# Patient Record
Sex: Male | Born: 1935 | Race: White | Hispanic: No | Smoking: Never smoker
Health system: Southern US, Community
[De-identification: ages and names within clinical notes are randomized; demographics above are authoritative.]

## PROBLEM LIST (undated history)

## (undated) DIAGNOSIS — C801 Malignant (primary) neoplasm, unspecified: Secondary | ICD-10-CM

## (undated) DIAGNOSIS — H269 Unspecified cataract: Secondary | ICD-10-CM

## (undated) HISTORY — DX: Malignant (primary) neoplasm, unspecified: C80.1

## (undated) HISTORY — DX: Unspecified cataract: H26.9

---

## 1968-12-10 HISTORY — PX: HERNIA REPAIR: SHX51

## 1968-12-10 HISTORY — PX: CATARACT EXTRACTION, BILATERAL: SHX1313

## 2010-12-10 DIAGNOSIS — C801 Malignant (primary) neoplasm, unspecified: Secondary | ICD-10-CM

## 2010-12-10 HISTORY — PX: PEG TUBE PLACEMENT: SUR1034

## 2010-12-10 HISTORY — DX: Malignant (primary) neoplasm, unspecified: C80.1

## 2011-10-05 ENCOUNTER — Ambulatory Visit: Payer: Self-pay

## 2012-04-18 ENCOUNTER — Emergency Department: Payer: Self-pay | Admitting: Emergency Medicine

## 2012-10-28 ENCOUNTER — Emergency Department: Payer: Self-pay | Admitting: Emergency Medicine

## 2012-11-01 ENCOUNTER — Emergency Department: Payer: Self-pay | Admitting: Emergency Medicine

## 2012-11-01 LAB — URINALYSIS, COMPLETE
Bilirubin,UR: NEGATIVE
Glucose,UR: NEGATIVE mg/dL (ref 0–75)
Leukocyte Esterase: NEGATIVE
Nitrite: NEGATIVE
Ph: 5 (ref 4.5–8.0)
RBC,UR: 8 /HPF (ref 0–5)
Squamous Epithelial: <1
WBC UR: 5 /HPF (ref 0–5)

## 2012-11-01 LAB — CBC
HGB: 13.7 g/dL (ref 13.0–18.0)
MCH: 35.8 pg — ABNORMAL HIGH (ref 26.0–34.0)
MCHC: 34.3 g/dL (ref 32.0–36.0)
Platelet: 170 10*3/uL (ref 150–440)
RBC: 3.83 10*6/uL — ABNORMAL LOW (ref 4.40–5.90)

## 2012-11-01 LAB — COMPREHENSIVE METABOLIC PANEL
Albumin: 3.7 g/dL (ref 3.4–5.0)
Anion Gap: 5 — ABNORMAL LOW (ref 7–16)
Bilirubin,Total: 0.7 mg/dL (ref 0.2–1.0)
Calcium, Total: 8.8 mg/dL (ref 8.5–10.1)
Co2: 26 mmol/L (ref 21–32)
Creatinine: 0.82 mg/dL (ref 0.60–1.30)
EGFR (Non-African Amer.): >60
Glucose: 97 mg/dL (ref 65–99)
Osmolality: 286 (ref 275–301)
Sodium: 142 mmol/L (ref 136–145)
Total Protein: 7 g/dL (ref 6.4–8.2)

## 2012-11-01 LAB — LIPASE, BLOOD: Lipase: 493 U/L — ABNORMAL HIGH (ref 73–393)

## 2012-11-03 ENCOUNTER — Emergency Department: Payer: Self-pay | Admitting: Emergency Medicine

## 2012-11-03 LAB — COMPREHENSIVE METABOLIC PANEL
Albumin: 3.8 g/dL (ref 3.4–5.0)
Alkaline Phosphatase: 126 U/L (ref 50–136)
Anion Gap: 8 (ref 7–16)
BUN: 18 mg/dL (ref 7–18)
Bilirubin,Total: 0.8 mg/dL (ref 0.2–1.0)
Co2: 24 mmol/L (ref 21–32)
Creatinine: 0.85 mg/dL (ref 0.60–1.30)
EGFR (African American): >60
EGFR (Non-African Amer.): >60
Glucose: 97 mg/dL (ref 65–99)
Osmolality: 279 (ref 275–301)
Potassium: 3.4 mmol/L — ABNORMAL LOW (ref 3.5–5.1)
Sodium: 139 mmol/L (ref 136–145)

## 2012-11-03 LAB — URINALYSIS, COMPLETE
Leukocyte Esterase: NEGATIVE
Nitrite: NEGATIVE
Ph: 5 (ref 4.5–8.0)
Protein: 30
RBC,UR: 21 /HPF (ref 0–5)
Transitional Epi: <1
WBC UR: 7 /HPF (ref 0–5)

## 2012-11-03 LAB — CBC WITH DIFFERENTIAL/PLATELET
Basophil #: 0 10*3/uL (ref 0.0–0.1)
Basophil %: 0.4 %
Eosinophil #: 0.1 10*3/uL (ref 0.0–0.7)
HCT: 40.8 % (ref 40.0–52.0)
HGB: 14.2 g/dL (ref 13.0–18.0)
MCH: 36.2 pg — ABNORMAL HIGH (ref 26.0–34.0)
MCV: 104 fL — ABNORMAL HIGH (ref 80–100)
Monocyte %: 7.3 %
Platelet: 170 10*3/uL (ref 150–440)
RBC: 3.93 10*6/uL — ABNORMAL LOW (ref 4.40–5.90)

## 2012-11-03 LAB — LIPASE, BLOOD: Lipase: 706 U/L — ABNORMAL HIGH (ref 73–393)

## 2012-11-05 LAB — STOOL CULTURE

## 2012-11-10 ENCOUNTER — Emergency Department: Payer: Self-pay | Admitting: Emergency Medicine

## 2012-11-10 LAB — COMPREHENSIVE METABOLIC PANEL
Albumin: 3.1 g/dL — ABNORMAL LOW (ref 3.4–5.0)
Anion Gap: 5 — ABNORMAL LOW (ref 7–16)
BUN: 17 mg/dL (ref 7–18)
Bilirubin,Total: 0.5 mg/dL (ref 0.2–1.0)
Co2: 28 mmol/L (ref 21–32)
Creatinine: 0.78 mg/dL (ref 0.60–1.30)
Glucose: 98 mg/dL (ref 65–99)
Potassium: 3.5 mmol/L (ref 3.5–5.1)
SGOT(AST): 57 U/L — ABNORMAL HIGH (ref 15–37)
SGPT (ALT): 79 U/L — ABNORMAL HIGH (ref 12–78)
Sodium: 138 mmol/L (ref 136–145)
Total Protein: 6.1 g/dL — ABNORMAL LOW (ref 6.4–8.2)

## 2012-11-10 LAB — MAGNESIUM: Magnesium: 2.1 mg/dL

## 2013-08-12 ENCOUNTER — Emergency Department: Payer: Self-pay | Admitting: Emergency Medicine

## 2013-12-04 IMAGING — CT CT ABD-PELV W/ CM
1 of 2 series · 14 of 32 positions shown, 18 images · non-contrast
Comparison: none

REASON FOR EXAM: (1) PANCREATITIS AND DIARRHEA; (2) PANCREATITIS AND
DIARRHEA
COMMENTS:

[Series 2: 3mm soft tissue · axial · 0.71mm/px · z∈[-778,-404]mm · 14 of 139 slices shown, 18 images]
[im 7/139  soft-tissue]
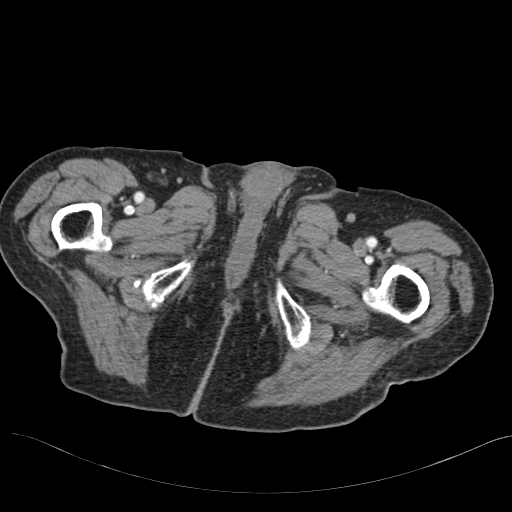
[im 7/139  bone]
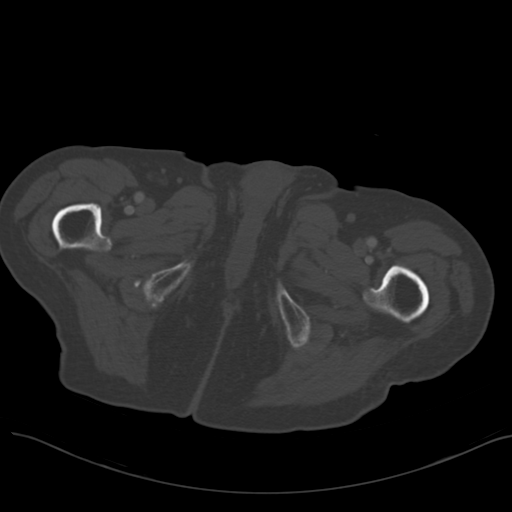
[im 19/139  soft-tissue]
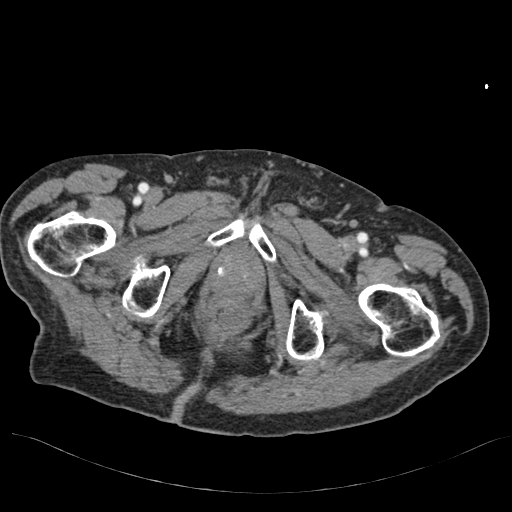
[im 32/139  soft-tissue]
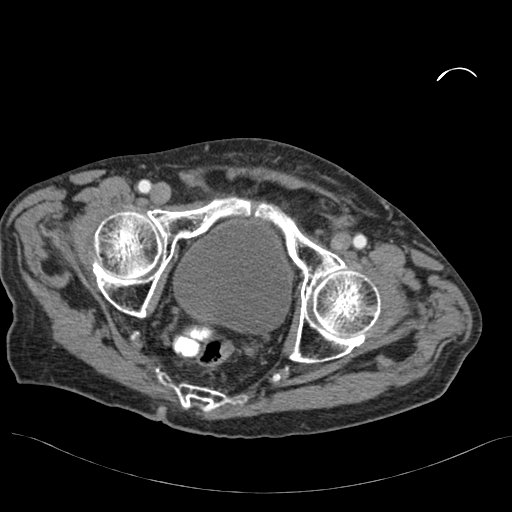
[im 44/139  soft-tissue]
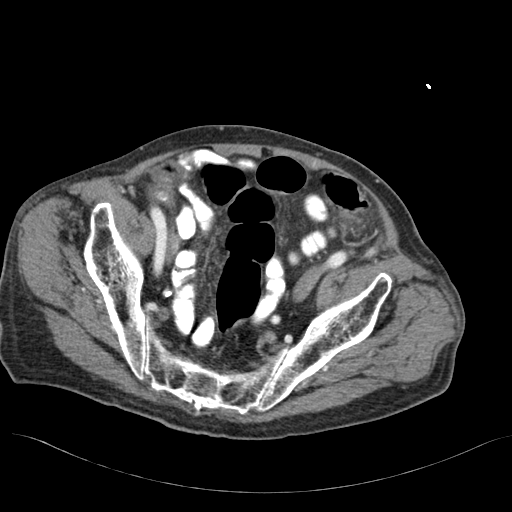
[im 51/139  soft-tissue]
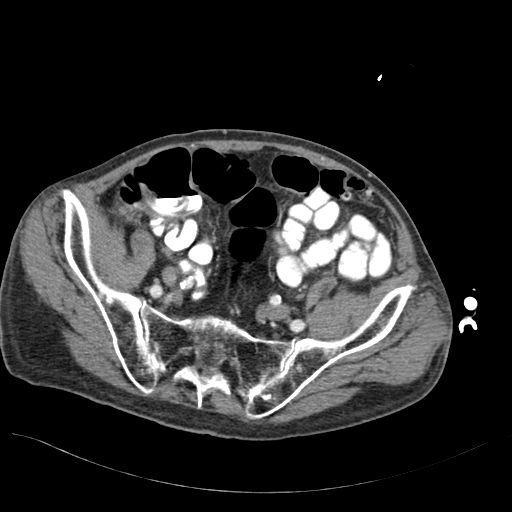
[im 63/139  soft-tissue]
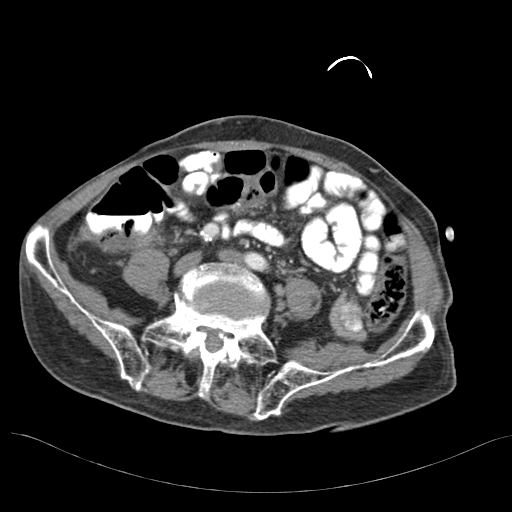
[im 76/139  soft-tissue]
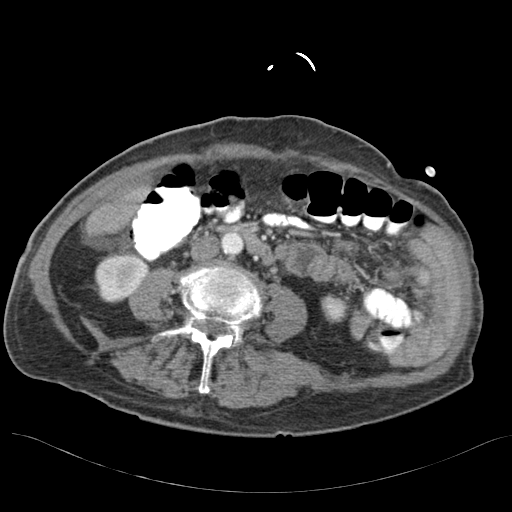
[im 88/139  soft-tissue]
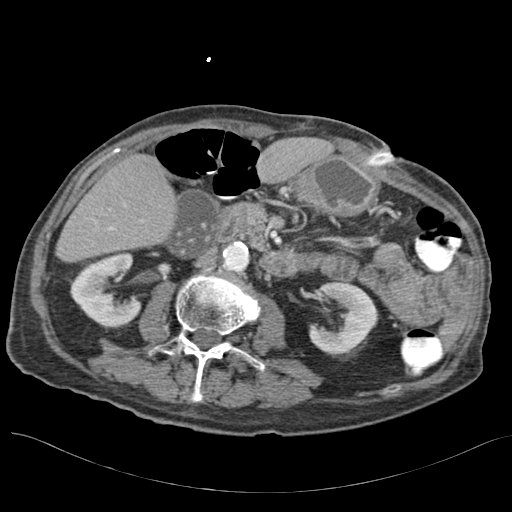
[im 95/139  soft-tissue]
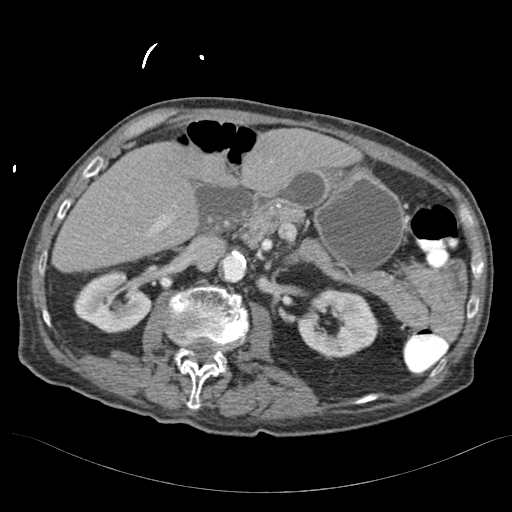
[im 95/139  bone]
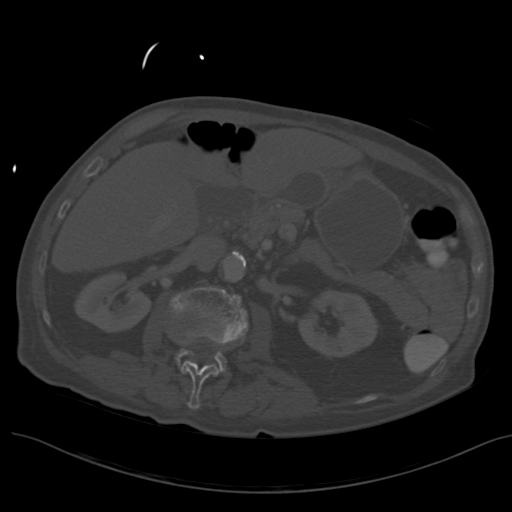
[im 107/139  soft-tissue]
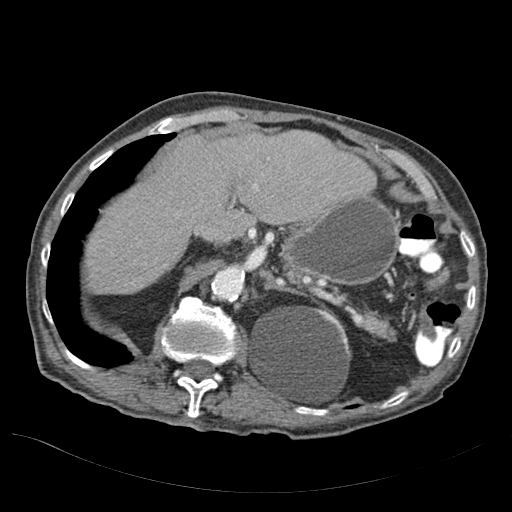
[im 113/139  lung]
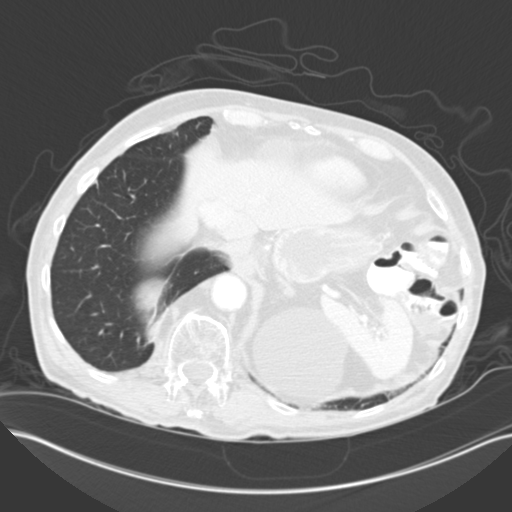
[im 120/139  soft-tissue]
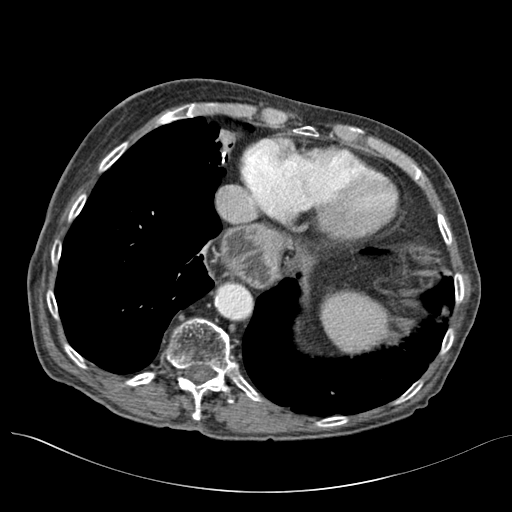
[im 120/139  lung]
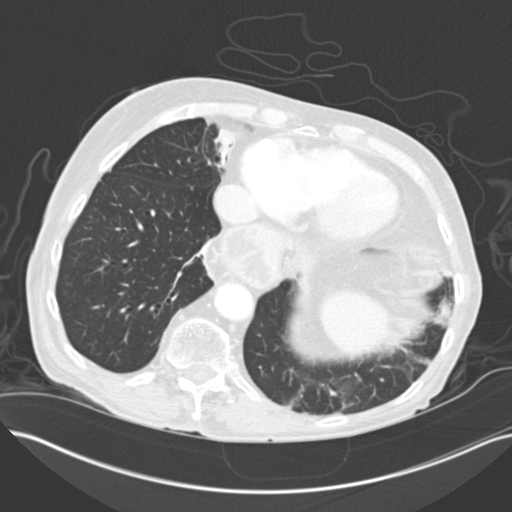
[im 126/139  lung]
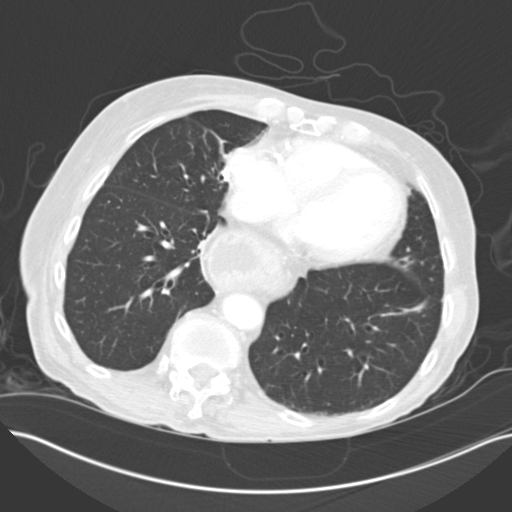
[im 132/139  soft-tissue]
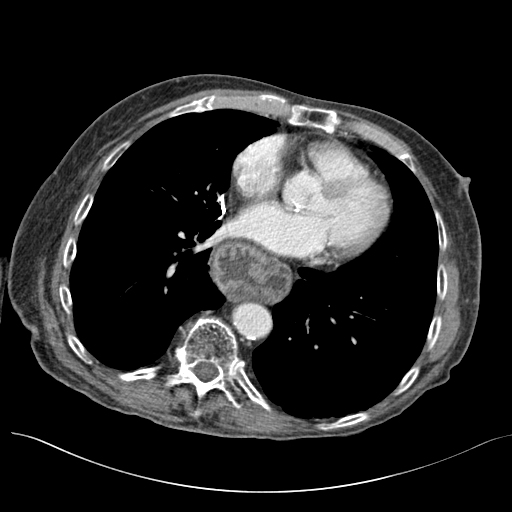
[im 132/139  lung]
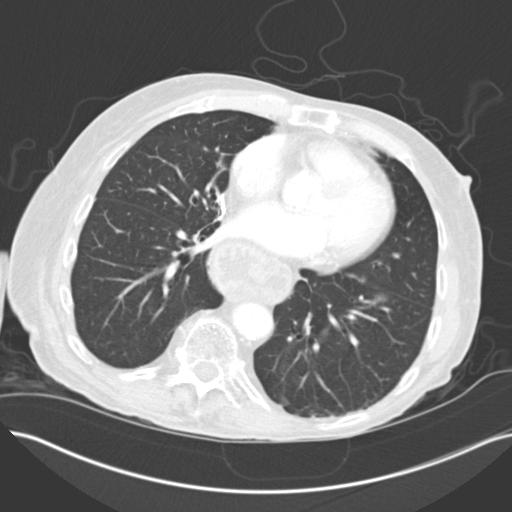

[14 of 32 positions shown; findings below may reference images not displayed]

PROCEDURE:     CT  - CT ABDOMEN / PELVIS  W  - November 01, 2012  [DATE]

RESULT:     CT of the abdomen and pelvis is performed with 100 mL of
[id number] iodinated intravenous contrast. Oral contrast was administered
but is primarily in the mid to distal small bowel and colon. Gastrostomy
tube is present. There is a moderate amount of fluid in the stomach. There
is a moderately large hiatal hernia at the upper margin of the film. The
lung bases show some emphysematous change and minimal atelectasis versus
fibrosis without a discrete mass. There may be some right middle lobe medial
segment bronchiectasis. No pleural or pericardial effusion is evident. There
is a large cyst appears to arise from the upper pole of the left kidney
measuring as much as 7.23 x 7.1 cm. There is a small cyst in the lower pole
the right kidney. Multiple stones are seen in a distended gallbladder. The
liver appears unremarkable. The spleen is within normal limits. The pancreas
is unremarkable. The adrenal glands appear normal. The abdominal aorta shows
atherosclerotic calcification without aneurysm. There is no significant
ascites. The urinary bladder is not abnormally distended. There is some
colonic diverticulosis with some mild adjacent inflammation in the mesentery
in the distal descending colon and proximal sigmoid colon which could
represent very mild diverticulitis. There is no perforation or abscess
formation evident. The appendix appears normal. There is a trace amount of
fluid in Morison's pouch and in the right retrocolic gutter. There may be
some mild thickening of the wall of the cecum or adjacent to the cecum. The
prostate is enlarged. Correlate with PSA and digital rectal exam.
Degenerative changes are noted in the spine.
IMPRESSION: 1. Colonic diverticulosis. Cannot exclude some mild left lower quadrant
diverticulitis. Possible pericecal inflammatory changes. Minimal wall
thickening is not excluded. The appendix appears normal.
2. Cholelithiasis without evidence of acute cholecystitis. Minimal amount of
free fluid in Morrison's pouch.
3. Other findings as listed above.

[REDACTED]

## 2014-04-02 ENCOUNTER — Emergency Department: Payer: Self-pay | Admitting: Emergency Medicine

## 2014-04-08 ENCOUNTER — Observation Stay: Payer: Self-pay | Admitting: Specialist

## 2014-04-08 LAB — CBC
HCT: 43.4 % (ref 40.0–52.0)
HGB: 14.6 g/dL (ref 13.0–18.0)
MCH: 34.7 pg — AB (ref 26.0–34.0)
MCHC: 33.6 g/dL (ref 32.0–36.0)
MCV: 103 fL — ABNORMAL HIGH (ref 80–100)
PLATELETS: 169 10*3/uL (ref 150–440)
RBC: 4.2 10*6/uL — ABNORMAL LOW (ref 4.40–5.90)
RDW: 12.5 % (ref 11.5–14.5)
WBC: 5.5 10*3/uL (ref 3.8–10.6)

## 2014-04-08 LAB — COMPREHENSIVE METABOLIC PANEL
ALBUMIN: 3.6 g/dL (ref 3.4–5.0)
ALK PHOS: 106 U/L
Anion Gap: 8 (ref 7–16)
BILIRUBIN TOTAL: 0.8 mg/dL (ref 0.2–1.0)
BUN: 20 mg/dL — ABNORMAL HIGH (ref 7–18)
CO2: 27 mmol/L (ref 21–32)
Calcium, Total: 9.2 mg/dL (ref 8.5–10.1)
Chloride: 96 mmol/L — ABNORMAL LOW (ref 98–107)
Creatinine: 0.71 mg/dL (ref 0.60–1.30)
EGFR (African American): >60
Glucose: 139 mg/dL — ABNORMAL HIGH (ref 65–99)
Osmolality: 268 (ref 275–301)
Potassium: 4.3 mmol/L (ref 3.5–5.1)
SGOT(AST): 42 U/L — ABNORMAL HIGH (ref 15–37)
SGPT (ALT): 42 U/L (ref 12–78)
Sodium: 131 mmol/L — ABNORMAL LOW (ref 136–145)
TOTAL PROTEIN: 7.6 g/dL (ref 6.4–8.2)

## 2014-04-08 LAB — LIPASE, BLOOD: Lipase: 214 U/L (ref 73–393)

## 2014-04-09 LAB — CBC WITH DIFFERENTIAL/PLATELET
BASOS ABS: 0 10*3/uL (ref 0.0–0.1)
BASOS PCT: 0.8 %
EOS PCT: 1.8 %
Eosinophil #: 0.1 10*3/uL (ref 0.0–0.7)
HCT: 36.1 % — ABNORMAL LOW (ref 40.0–52.0)
HGB: 12.1 g/dL — ABNORMAL LOW (ref 13.0–18.0)
Lymphocyte #: 0.9 10*3/uL — ABNORMAL LOW (ref 1.0–3.6)
Lymphocyte %: 19.9 %
MCH: 34.3 pg — ABNORMAL HIGH (ref 26.0–34.0)
MCHC: 33.6 g/dL (ref 32.0–36.0)
MCV: 102 fL — ABNORMAL HIGH (ref 80–100)
Monocyte #: 0.6 x10 3/mm (ref 0.2–1.0)
Monocyte %: 14.1 %
Neutrophil #: 2.9 10*3/uL (ref 1.4–6.5)
Neutrophil %: 63.4 %
Platelet: 145 10*3/uL — ABNORMAL LOW (ref 150–440)
RBC: 3.54 10*6/uL — AB (ref 4.40–5.90)
RDW: 12.3 % (ref 11.5–14.5)
WBC: 4.6 10*3/uL (ref 3.8–10.6)

## 2015-04-02 NOTE — Consult Note (Signed)
Pt seen and examined. Full consult to follow. Hx of G tube placement several years ago for nutritional support. Hx of throat ca. Had G tube replaced in ER 1 wk ago. Some bleeding at G site with few day hx of melena. Admitted overnight for observation. There was some drop in hgb. However, no abd pain. No active bleeding at G site. No melena. Tolerating TF without any problems. Pt prob had some bleeding at G insertion site. Pt not interested in any EGD. Stable for discharge today. Can give daily PPI. If patient cannot swallow pills, then give PPI via G tube, ex. powder form of prevacid or protonix suspension. Will sign off. Thanks.   Electronic Signatures: Verdie Shire (MD) (Signed on 01-May-15 07:58)  Authored   Last Updated: 01-May-15 07:59 by Verdie Shire (MD)

## 2015-04-02 NOTE — Consult Note (Signed)
PATIENT NAME:  Sean Boyd, Sean Boyd MR#:  [id number] DATE OF BIRTH:  [birth year]  DATE OF ADMISSION:  04/08/2014 DATE OF CONSULTATION:  04/09/2014  CONSULTING PHYSICIAN:  Lupita Dawn. Candace Cruise, MD  REASON FOR REFERRAL:  Melena.   The patient is a 81 year old white male who has a history of throat cancer and had a G-tube placed elsewhere several years ago. He comes into the Emergency Room because of some bleeding around his G-tube site. Apparently, the G-tube fell out about a week ago and had it replaced in the Emergency Room on April 24. He had noticed some black stools over the last 2 days. He was having some constipation, took enema yesterday; that is when he noticed some melanotic stool. In the Emergency Room, he was hemodynamically stable. His hemoglobin was stable. However, because of his age, the decision was made to bring the patient in for observation overnight. I was asked to see the patient this morning. The patient was sleeping initially. He is rather hard of hearing. He was not able to provide a whole lot of history. However, he said he has no trouble with abdominal pain. He denied having any abdominal pain and he was tolerating tube feeding without any difficulty. Did not notice any melena since being admitted for observation.   PAST MEDICAL HISTORY:  Notable for throat cancer and hypothyroidism.   He has no known drug allergies.   He takes levothyroxine. He takes Jevity 1.5, 5 cans daily.   PAST SURGICAL HISTORY:  Includes G-tube placement.   SOCIAL HISTORY:  He lives with his daughter. Denies any alcohol or tobacco history.   FAMILY HISTORY:  Notable for hypertension.   REVIEW OF SYSTEMS:  There are no fevers, chills or weakness. There are no visual changes or hearing changes. There is no coughing or shortness of breath. There is no chest pain or palpitations. There is no nausea, vomiting or diarrhea and no abdominal pain. The rest of the review of symptoms is negative.   PHYSICAL  EXAMINATION: GENERAL:  The patient is an elderly gentleman in no acute distress.  VITAL SIGNS:  He is afebrile. His vital signs are stable.  HEENT:  Showed normocephalic, atraumatic head. Pupils are equally reactive. Throat was clear.   NECK:  Supple. CARDIAC:  Regular rhythm and rate without murmurs.  LUNGS:  Clear bilaterally.  ABDOMEN:  Showed normoactive bowel sounds, soft. It was nontender. G-tube was in place. There was dressing over it. I removed the dressing. There was no active bleeding around the G-tube site. The bundle was actually pulled up too much. I was able to push it in until it was snug against the abdominal wall.  NEUROLOGIC:  The patient is intact.  SKIN:  Negative.   LABORATORY DATA: Hemoglobin is 14.6; on admission it is 12.1. Electrolytes are normal, except for sodium 131, BUN 20, glucose 139. Liver enzymes are normal except for AST of 42.   This is a patient with probably bleeding around the G-tube site. Some of bleeding probably passed through the stomach and came out through the rectum as melena. Hemoglobin has dropped, but there are no signs of active bleeding. I doubt that the patient has any ulcerations or anything like that. There is bleeding. There may be some irritation at the insertion site that caused the bleeding initially.   I did talk to the patient about doing an endoscopy to kind of look at the G-tube site internally and also look for any ulcers. However, the patient  did not want anything done. He is stable for discharge today. If there is further bleeding in the future, the patient can follow up with [place] in the office.   Thank you for the referral.    ____________________________ Lupita Dawn. Candace Cruise, MD pyo:dmm D: 04/09/2014 10:19:00 ET T: 04/09/2014 10:47:45 ET JOB#: [id number]  cc: Lupita Dawn. Candace Cruise, MD, <Dictator> Lupita Dawn Ezmae Speers MD ELECTRONICALLY SIGNED 04/12/2014 8:10

## 2015-04-02 NOTE — H&P (Signed)
PATIENT NAME:  Sean Boyd, Sean Boyd MR#:  [id number] DATE OF BIRTH:  1921-06-20  DATE OF ADMISSION:  04/08/2014  PRESENTING COMPLAINT: Black-colored stools.   HISTORY OF PRESENT ILLNESS: Sean Boyd is a pleasant 81 year old Caucasian gentleman who has history of throat cancer, status post gastric tube/PEG tube placement couple years ago comes in after he noticed some oozing around his PEG tube which was replaced last week on 04/24 in the Emergency Room when it had accidentally come off at home. The patient also noticed that he has been having melanotic stools for the last 2 days. He was constipated, took an enema yesterday and noticed to have a large melanotic stool. He comes to the Emergency Room where he is hemodynamically stable.  During my evaluation, his PEG site looks okay. He has some secretions; however, does not appear to be actively bleeding. His H and H is stable. He is being admitted for further evaluation and management.   PAST MEDICAL HISTORY: 1.  Throat cancer.  2.  Hypothyroidism.   ALLERGIES: No known drug allergies.   MEDICATIONS:  Levothyroxine p.o. daily.  The patient takes Jevity 1.5 bolus feeds.  He uses 5 cans a day.   PAST SURGICAL HISTORY:  Gastric tube placement secondary to throat cancer.   SOCIAL HISTORY: The patient lives with her daughters at home. Nonsmoker, nonalcoholic. Denies any drug use.   FAMILY HISTORY: Positive for hypertension.  REVIEW OF SYSTEMS:  CONSTITUTIONAL: No fever, fatigue, weakness.  EYES: No blurred or double vision, glaucoma or cataracts.  EARS, NOSE, THROAT: No tinnitus, ear pain, hearing loss or postnasal drip.  RESPIRATORY: No cough, wheeze, hemoptysis or COPD.  CARDIOVASCULAR: No chest pain, orthopnea, edema or hypertension.  GASTROINTESTINAL: No nausea, vomiting, diarrhea. Positive for melena.  GENITOURINARY: No dysuria, hematuria or frequency.  ENDOCRINE: No polyuria, nocturia or thyroid problems. The patient has  hypothyroidism, chronic.  HEMATOLOGY: No anemia, easy bruising or bleeding.  SKIN: No acne, rash or lesions.  MUSCULOSKELETAL: Positive for left ankle pain. No swelling, gout or redness.  NEUROLOGIC: No CVA, TIA, ataxia or dementia.  All other systems reviewed are negative.   PHYSICAL EXAMINATION: GENERAL: The patient is awake, alert, oriented x 3, not in acute distress, afebrile.  VITAL SIGNS:  Pulse is 76. Blood pressure is 135/67. Sats are 97% on room air.  HEENT: Atraumatic, normocephalic. PERRLA.  EOM intact. Oral mucosa is moist.  NECK: Supple. No JVD. No carotid bruit.  RESPIRATORY: Clear to auscultation bilaterally. No rales, rhonchi, respiratory distress or labored breathing.  CARDIOVASCULAR: Both heart sounds are normal. Rate, rhythm regular. PMI not lateralized. Chest nontender.  EXTREMITIES: Good pedal pulses. Good femoral pulses. No lower extremity edema.  ABDOMEN: Soft, benign, nontender. PEG tube is present. There is no active bleeding in the PEG site; however, there is some serous sanguinous discharge in the area.  SKIN: Warm and dry.  NEUROLOGIC: Grossly intact cranial nerves II through XII. No motor or sensory deficit.  PSYCHIATRIC: The patient is awake, alert, oriented x 3.  Abdominal x-ray shows consistent with scarring or atelectasis in the lung bases. PEG tube noted position of the left upper abdomen. No bowel distention.   H and H is 14.6 and 43.4. White count is 5.5. Lipase is 214. Comprehensive metabolic panel within normal limits, except SGOT of 42 and sodium 131.  ASSESSMENT:  A 81 year old, Sean Boyd, with history of throat cancer status post PEG tube placement comes in with:  1.  Melena. The patient is going to  be admitted on the medical floor. We will monitor her hemoglobin and hematocrit. This is likely due to possible gastritis due to PEG tube irritation when it was replaced last week during his emergency room visit on 04/24 when it had accidentally got  displaced. The patient will be put on Protonix twice daily. I spoke with Dr. Candace Cruise, recommends to monitor hemoglobin for now. No indication for urgent endoscopy at this time, appears to be likely acute gastritis in the setting of PEG tube reinsertion. We will check hemoglobin in the morning. We will continue PEG tube feeding with Jevity 1.5. Spoke with dietitian and they will reinitiate his tube feeding.  2.  History of throat cancer status post PEG tube replacement 04/24. The patient reports oozing of some blood from the PEG site area. During my evaluation, there is no active bleeding. We will continue to monitor, do dressing changes as needed.  3.  Hypothyroidism. Resume home dose of levothyroxine.  4.  Deep vein thrombosis prophylaxis. We will continue sequential compression devices or TEDs. Hold off on any antiplatelet agents in the setting of melena.  5.  Further workup per the patient's clinical course. Hospital admission plan was discussed with the patient and the patient's son who is present in the Emergency Room.   TIME SPENT: 40 minutes.    ____________________________ Hart Rochester Posey Pronto, MD sap:ce D: 04/08/2014 17:31:49 ET T: 04/08/2014 19:03:23 ET JOB#: [id number]  cc: Imri Lor A. Posey Pronto, MD, <Dictator> Lupita Dawn. Candace Cruise, MD Ilda Basset MD ELECTRONICALLY SIGNED 04/09/2014 12:51

## 2015-04-02 NOTE — Discharge Summary (Signed)
PATIENT NAME:  Sean Boyd, Sean Boyd MR#:  [id number] DATE OF BIRTH:  November 11, 1921  DATE OF ADMISSION:  04/08/2014 DATE OF DISCHARGE:  04/09/2014  For a detailed note, please take a look at the history and physical on admission by Dr. Fritzi Mandes.  DIAGNOSES AT DISCHARGE:  As follows: 1.  Melena. 2.  Status post gastrostomy tube placement due to history of esophageal cancer. 3.  Hypothyroidism.  The patient is being discharged on his tube feeds.   ACTIVITY: As tolerated.  FOLLOWUP:  Dr. Clayborn Bigness in the next 1 to 2 weeks.   DISCHARGE MEDICATIONS:  Synthroid by G-tube daily and Protonix 40 mg suspension by G-tube daily.  CONSULTATIONS DURING THE HOSPITAL COURSE:  Dr. Verdie Shire from gastroenterology.  PERTINENT STUDIES DONE DURING THE HOSPITAL COURSE:  Abdominal 3-way done on admission showing unremarkable abdominal exam.   BRIEF HOSPITAL COURSE:  This is a 81 year old male who presented to the hospital due to melanotic stools and is suspicious for GI bleed melena.  PROBLEM: 1.  Melena.  The most likely cause of patient's dark stools was some minor gastrointestinal oozing from the PEG tube site. The patient recently had his PEG tube change about a week ago. He had some irritation near the site and probably bled from the site and had some melanotic stools secondary to that. He had no further evidence of acute bleeding in the hospital. The patient was seen by gastroenterology and, as per Dr. Candace Cruise, there was no need for further acute endoscopic evaluation or intervention. The patient's hemoglobin remained stable and he had no evidence of bleeding. He was discharged on proton pump inhibitor daily and with close follow up with his primary care physician.  2.  Hypothyroidism. The patient was maintained on his Synthroid. He will resume that.   The patient is a FULL CODE. He was discharged home.   TIME SPENT: 35 minutes.    ____________________________ Belia Heman. Verdell Carmine, MD vjs:ce D: 04/09/2014  15:07:46 ET T: 04/09/2014 17:05:26 ET JOB#: [id number]  cc: Belia Heman. Verdell Carmine, MD, <Dictator> Lavera Guise, MD Henreitta Leber MD ELECTRONICALLY SIGNED 04/21/2014 14:03

## 2015-05-05 ENCOUNTER — Encounter: Payer: Self-pay | Admitting: *Deleted

## 2015-05-11 IMAGING — CR DG ABDOMEN 3V
1 series · 3 of 3 positions shown · non-contrast
Comparison: CT 11/01/2012.

CLINICAL DATA: Nausea.  Pain.

EXAM:
ABDOMEN SERIES

[Series 1: w chest pa · 0.14mm/px · 3 of 3 slices shown]
[im 1/3]
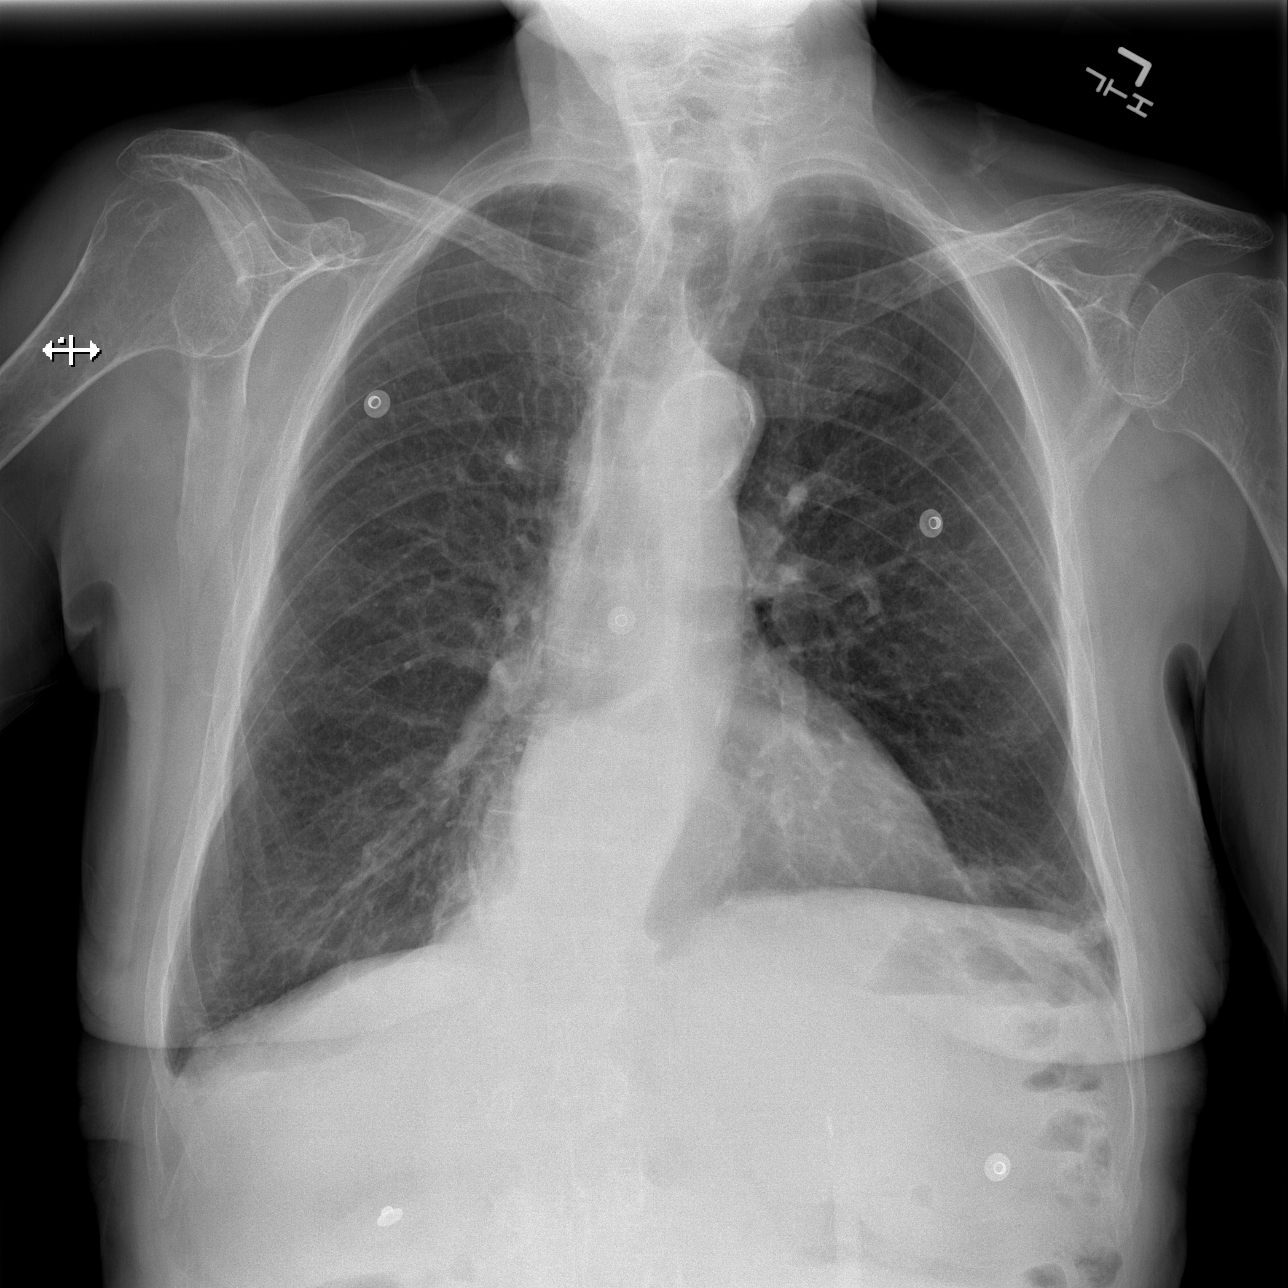
[im 2/3]
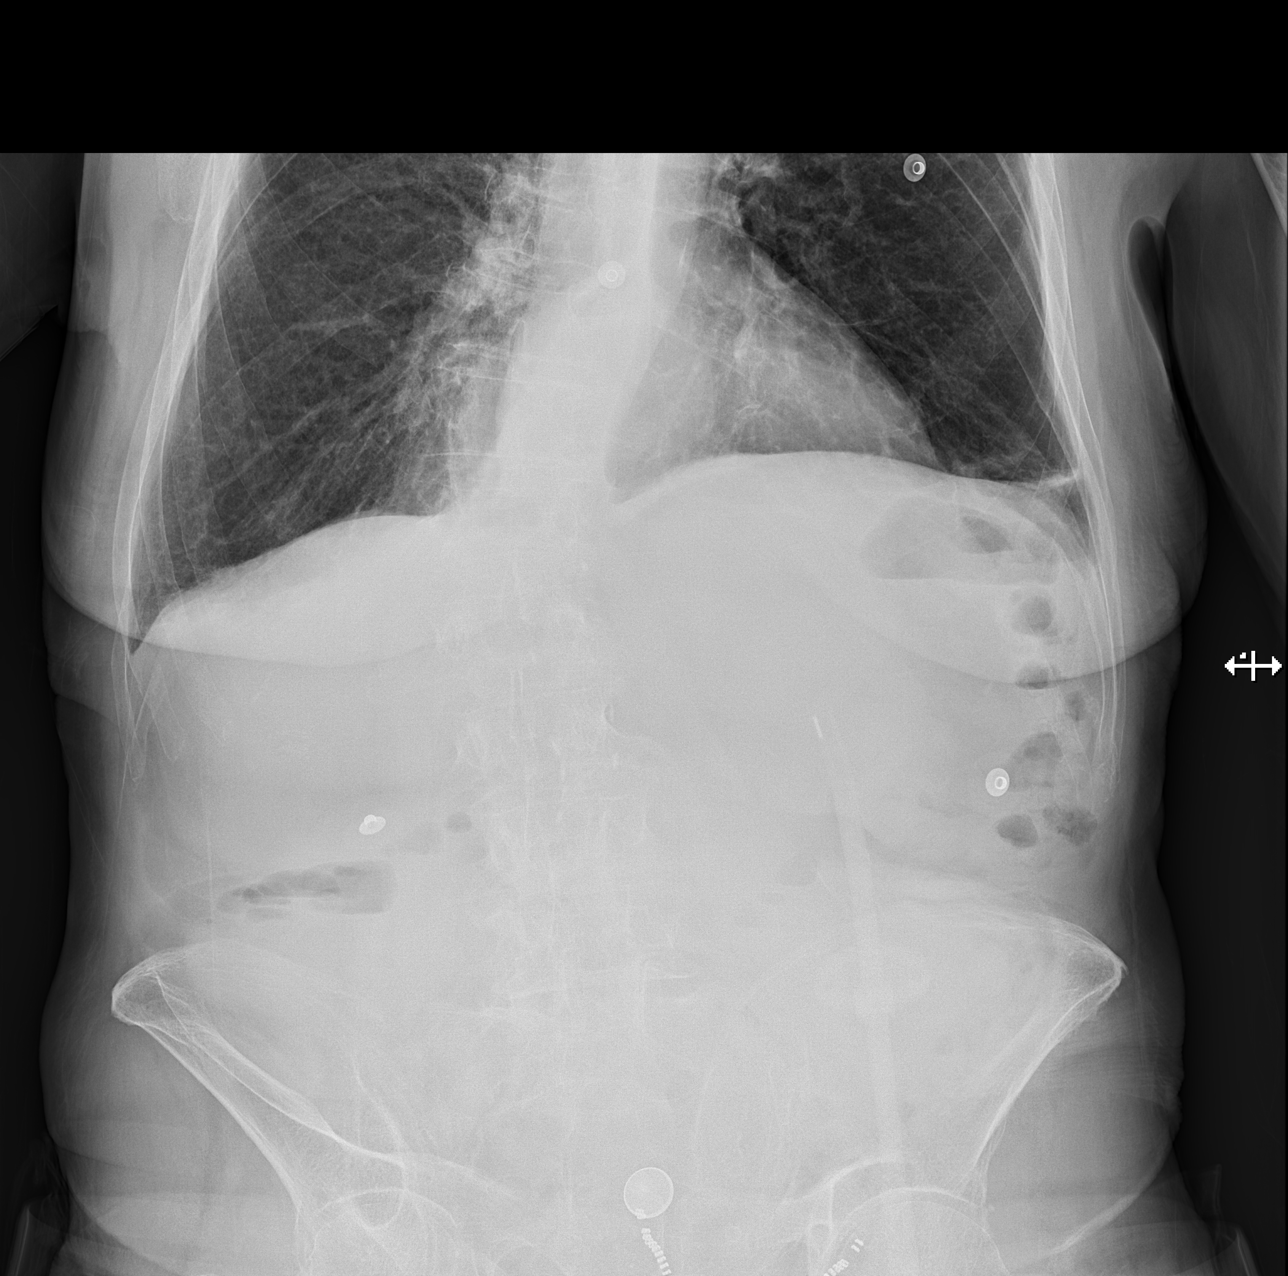
[im 3/3]
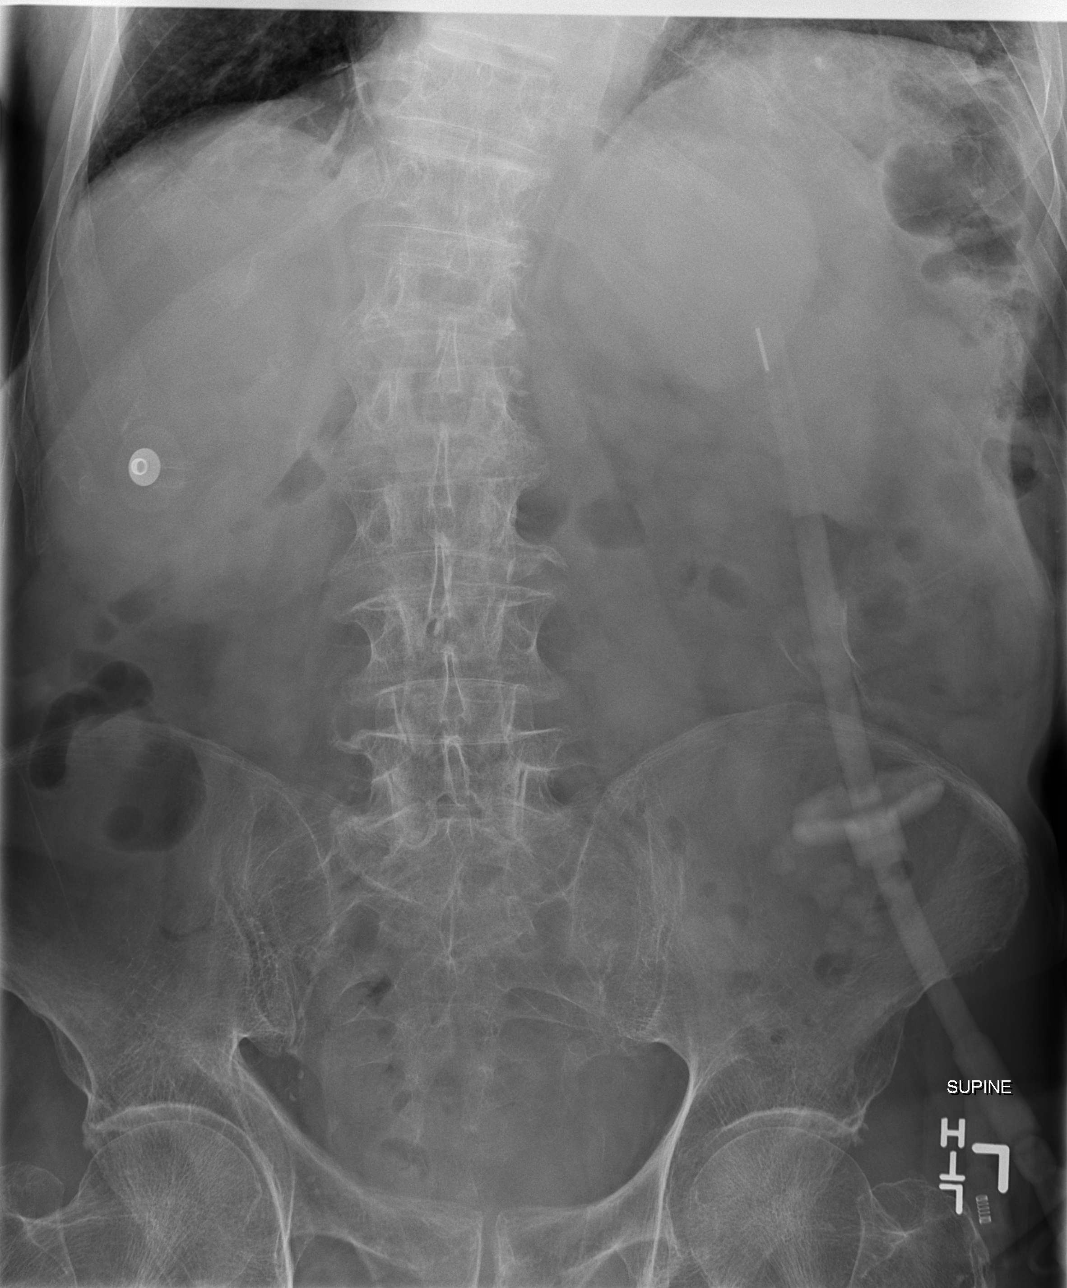

[3 of 3 positions shown; findings below may reference images not displayed]

FINDINGS: Mediastinum and hilar structures are normal. Lungs are clear of
acute infiltrate. Atelectasis and/or scarring lung bases,
particularly on the left, unchanged from prior CT of 11/01/2012.
Mild infiltrate left lung base cannot be excluded. Heart size
normal. Large sliding hiatal hernia. No acute bony abnormality.

Soft tissue structures are unremarkable. The gas pattern is
nonspecific. Peg tube noted over the left upper abdomen.
IMPRESSION: 1. Findings most consistent with scarring and/or atelectasis in the
lung bases. Mild pneumonitis in the left lung base cannot be
excluded .
2. Abdomen unremarkable. Peg tube noted with position of the left
upper abdomen. No bowel distention .

## 2015-05-19 ENCOUNTER — Ambulatory Visit (INDEPENDENT_AMBULATORY_CARE_PROVIDER_SITE_OTHER): Payer: Medicare Other | Admitting: General Surgery

## 2015-05-19 ENCOUNTER — Encounter: Payer: Self-pay | Admitting: General Surgery

## 2015-05-19 VITALS — BP 128/62 | HR 62 | Resp 12 | Ht 64.0 in | Wt 129.0 lb

## 2015-05-19 DIAGNOSIS — B369 Superficial mycosis, unspecified: Secondary | ICD-10-CM | POA: Diagnosis not present

## 2015-05-19 DIAGNOSIS — K9423 Gastrostomy malfunction: Secondary | ICD-10-CM | POA: Diagnosis not present

## 2015-05-19 NOTE — Progress Notes (Signed)
Patient ID: Sean Boyd, male   DOB: 07/07/1921, 81 y.o.   MRN: [id number]  Chief Complaint  Patient presents with  . Other    Eval for PEG Tube    HPI Sean Boyd is a 81 y.o. male here today for evaluation of PEG tube. Patient was diagnosed with esophageal cancer in 2012. A PEG tube- 22 [place]- was placed at [facility]. He was treated with chemo and radiation. Patient states there is redness and some draining around the tube and it does leak. He says it does stay sore. Patient has been unable to eat and uses PEG tube for all of his nutritional intake.     HPI  Past Medical History  Diagnosis Date  . Cataract   . Cancer 2012    esopahagus cancer treated at [facility]. Treated with chemo and radiation.     Past Surgical History  Procedure Laterality Date  . Hernia repair  1970  . Cataract extraction, bilateral  1970  . Peg tube placement  2012    [facility]    History reviewed. No pertinent family history.  Social History History  Substance Use Topics  . Smoking status: Never Smoker   . Smokeless tobacco: Never Used  . Alcohol Use: No    No Known Allergies  Current Outpatient Prescriptions  Medication Sig Dispense Refill  . levothyroxine (SYNTHROID, LEVOTHROID) 100 MCG tablet 100 mcg by PEG Tube route daily.     No current facility-administered medications for this visit.    Review of Systems Review of Systems  Constitutional: Negative.   Respiratory: Negative.   Cardiovascular: Positive for leg swelling (left ).    Blood pressure 128/62, pulse 62, resp. rate 12, height 5\' 4"  (1.626 m), weight 129 lb (58.514 kg).  Physical Exam Physical Exam  Constitutional: He is oriented to person, place, and time.  Appears elderly  Eyes: Conjunctivae are normal.  Neck: Neck supple.  Abdominal: Soft. There is no hepatomegaly. There is no tenderness.    Neurological: He is alert and oriented to person, place, and time.  Skin: Skin is warm and dry. Rash noted. There is  erythema.    Data Reviewed    Assessment    Fungal infection of the skin fold of the upper abdominal wall. G tube likely needs change. In the meanwhile recommeded OTC fungal cram to affected skin.      Plan   Pt advised on the need to use the skin shield to keep the tube snug. This was demonstrated to pt and his daughter. Get Antifungal cream from the drug store. Keep skin dry. Keep PEG tube shield against the skin to prevent leaking.   Big channel of PEG tube is blocked. The small channel flushed and working well.  Follow up: Monday 05/23/15   DTO:[id number]  Genice Kimberlin G 05/19/2015, 2:23 PM

## 2015-05-19 NOTE — Patient Instructions (Signed)
Get Antifungal cream from the drug store. Keep skin dry. Keep PEG tube shield against the skin to prevent leaking.

## 2015-05-23 ENCOUNTER — Ambulatory Visit (INDEPENDENT_AMBULATORY_CARE_PROVIDER_SITE_OTHER): Payer: Medicare Other | Admitting: General Surgery

## 2015-05-23 ENCOUNTER — Encounter: Payer: Self-pay | Admitting: General Surgery

## 2015-05-23 VITALS — BP 124/56 | HR 62 | Resp 12 | Wt 129.0 lb

## 2015-05-23 DIAGNOSIS — K9423 Gastrostomy malfunction: Secondary | ICD-10-CM | POA: Diagnosis not present

## 2015-05-23 NOTE — Progress Notes (Signed)
Patient here today to follow up on PEG Tube and fungal rash on abdomen. Patient states it is still sore and red, has been using OTC fungal cream everyday. Exam shows he still has no seal on abd wall and leaks easily. Skin rash on the long skin fold is unchanged. A new 53 fr [place]  Y g-tube was placed without difficulty. Balloon inflated with 68ml saline. Tube flshes easily. Skin shield at 3.5cm mark. Pt and his daughter advised fully.  Follow up prn.

## 2015-05-26 ENCOUNTER — Emergency Department
Admission: EM | Admit: 2015-05-26 | Discharge: 2015-05-26 | Disposition: A | Discharge status: Home / Self Care | Payer: Medicare Other | Attending: Emergency Medicine | Admitting: Emergency Medicine

## 2015-05-26 ENCOUNTER — Encounter: Payer: Self-pay | Admitting: Emergency Medicine

## 2015-05-26 DIAGNOSIS — Z79899 Other long term (current) drug therapy: Secondary | ICD-10-CM | POA: Insufficient documentation

## 2015-05-26 DIAGNOSIS — K9423 Gastrostomy malfunction: Secondary | ICD-10-CM

## 2015-05-26 DIAGNOSIS — B372 Candidiasis of skin and nail: Secondary | ICD-10-CM

## 2015-05-26 DIAGNOSIS — K9429 Other complications of gastrostomy: Secondary | ICD-10-CM | POA: Diagnosis present

## 2015-05-26 MED ORDER — NYSTATIN 100000 UNIT/GM EX POWD: CUTANEOUS | Status: AC

## 2015-05-26 NOTE — ED Notes (Signed)
Pt had a feeding tube placed on Monday, states there is leaking at insertion site, pt denies pain.

## 2015-05-26 NOTE — ED Provider Notes (Signed)
[facility] Emergency Department Provider Note  ____________________________________________  Time seen: Approximately 10:30 AM  I have reviewed the triage vital signs and the nursing notes.   HISTORY  Chief Complaint Drainage from Incision    HPI Sean Boyd is a 81 y.o. male history of esophageal cancer and a PEG tube. He isn't comes to the ER because he has had ongoing drainage from around the PEG site. He states "liquid" is draining around the site. Per the patient and his family he was seen by Dr. Jamal Collin a couple days ago and had his tube replaced because it was blocked. He continues to have drainage from around the tube site. He was able to give his feed through it today, and does not think is blocked.  He denies pain. He does have a rash around the area of the drain site which is been there for months. States it is "skin irritation" from his feeds. Of note, the patient states he is having trouble with the cover on the tube (which she is referring the shield).  No nausea or vomiting. No chest pain. He is unable to swallow secretions, but this is chronic. His initial PEG tube was placed at [facility].   Past Medical History  Diagnosis Date  . Cataract   . Cancer 2012    esopahagus cancer treated at [facility]. Treated with chemo and radiation.     There are no active problems to display for this patient.   Past Surgical History  Procedure Laterality Date  . Hernia repair  1970  . Cataract extraction, bilateral  1970  . Peg tube placement  2012    [facility]    Current Outpatient Rx  Name  Route  Sig  Dispense  Refill  . levothyroxine (SYNTHROID, LEVOTHROID) 100 MCG tablet   PEG Tube   100 mcg by PEG Tube route daily.         Marland Kitchen nystatin (MYCOSTATIN/NYSTOP) 100000 UNIT/GM POWD      Apply a light area of cream to affected area of redness/rash twice a day for 2 weeks   60 g   0     Allergies Review of patient's allergies indicates no  known allergies.  No family history on file.  Social History History  Substance Use Topics  . Smoking status: Never Smoker   . Smokeless tobacco: Never Used  . Alcohol Use: No    Review of Systems Constitutional: No fever/chills Eyes: No visual changes. ENT: No sore throat. Cardiovascular: Denies chest pain. Respiratory: Denies shortness of breath. Gastrointestinal: No abdominal pain.  No nausea, no vomiting.  No diarrhea.  No constipation. Genitourinary: Negative for dysuria. Musculoskeletal: Negative for back pain. Skin: Negative for rash except skin irritation around his tube. Neurological: Negative for headaches, focal weakness or numbness.  10-point ROS otherwise negative.  ____________________________________________   PHYSICAL EXAM:  VITAL SIGNS: ED Triage Vitals  Enc Vitals Group     BP 05/26/15 0908 117/73 mmHg     Pulse Rate 05/26/15 0908 73     Resp 05/26/15 0908 18     Temp 05/26/15 0908 97.8 F (36.6 C)     Temp Source 05/26/15 0908 Oral     SpO2 05/26/15 0908 98 %     Weight 05/26/15 0908 129 lb (58.514 kg)     Height 05/26/15 0908 5\' 4"  (1.626 m)     Head Cir --      Peak Flow --      Pain  Score --      Pain Loc --      Pain Edu? --      Excl. in [place]? --    Constitutional: Alert and oriented. Well appearing and in no acute distress. He does occasionally have to spit up saliva. Eyes: Conjunctivae are normal. PERRL. EOMI. Head: Atraumatic. Nose: No congestion/rhinnorhea. Mouth/Throat: Mucous membranes are moist.  Oropharynx non-erythematous. Neck: No stridor.   Cardiovascular: Normal rate, regular rhythm. Grossly normal heart sounds.  Good peripheral circulation. Respiratory: Normal respiratory effort.  No retractions. Lungs CTAB. Gastrointestinal: Soft and nontender. No distention. No abdominal bruits. No CVA tenderness. There is a PEG tube in the left upper quadrant.  The PEG tube was evaluated by myself and the nurse, we're able to draw back  gastric fluid, and it flushes easily with 20 cc of normal saline. The shield on the PEG tube was notably about 5 cm up from the skin, I replaced this so that is was under light tension to hold his PEG tube balloon in place. There is mild erythema within all skin folds over the stomach, with small satellite lesions. There is no acute skin breakdown. The area is not tender or indurated. Musculoskeletal: No lower extremity tenderness nor edema.  No joint effusions. Neurologic:  Normal speech and language. No gross focal neurologic deficits are appreciated. Speech is normal. No gait instability. Patient is very hard of hearing. Skin:  Skin is warm, dry and intact. No rash noted. Psychiatric: Mood and affect are normal. Speech and behavior are normal.  ____________________________________________   LABS (all labs ordered are listed, but only abnormal results are displayed)  Labs Reviewed - No data to display ____________________________________________  EKG   ____________________________________________  RADIOLOGY   ____________________________________________   PROCEDURES  Procedure(s) performed: None  Critical Care performed: No  ____________________________________________   INITIAL IMPRESSION / ASSESSMENT AND PLAN / ED COURSE  Pertinent labs & imaging results that were available during my care of the patient were reviewed by me and considered in my medical decision making (see chart for details).  PEG tube evaluation. Based on exam I suspect the patient's PEG tube is leaking because his shield is often working its way up the tubing, which allows the balloon to be loose and contents to flow out. There is not appear to be evidence of an acute blockage of the PEG. Does have localized satellite lesions and apparent candidiasis of the skin folds over the abdomen which is evidently chronic and he is placing fungal cream on this.  I called and spoke with Dr. Jamal Collin, hewent well and  it sounds as though the patient was seen in clinic for similar problem but did have a blockage at that time. There is no evidence of acute blockage now, and Dr. Jamal Collin reports that he has tried educating the patient many many times on use of the shield and why he will continue to have leakage of the shield is not down. Dr. Jamal Collin recommends against suturing as the patient does have skin irritation is concern superinfection could occur.  Patient is stable. I placed the shield back on his PEG tube and he is not having further drainage. I discussed with him and his daughter, care recommendations and follow-up with his surgeon. In addition, Dr. Jamal Collin recommends the patient follow up with his surgeon at [facility] to see if there is anything else sick in be done surgically to better adhere his PEG tube down.  Patient and family are agreeable. Return precautions  discussed. I will provide a prescription for nystatin cream for his candidiasis. ____________________________________________   FINAL CLINICAL IMPRESSION(S) / ED DIAGNOSES  Final diagnoses:  Candidiasis, cutaneous  PEG tube malfunction      Delman Kitten, MD 05/26/15 1547

## 2015-05-26 NOTE — Discharge Instructions (Signed)
Candida Infection A Candida infection (also called yeast, fungus, and Monilia infection) is an overgrowth of yeast that can occur anywhere on the body. A yeast infection commonly occurs in warm, moist body areas. Usually, the infection remains localized but can spread to become a systemic infection. A yeast infection may be a sign of a more severe disease such as diabetes, leukemia, or AIDS. A yeast infection can occur in both men and women. In women, Candida vaginitis is a vaginal infection. It is one of the most common causes of vaginitis. Men usually do not have symptoms or know they have an infection until other problems develop. Men may find out they have a yeast infection because their sex partner has a yeast infection. Uncircumcised men are more likely to get a yeast infection than circumcised men. This is because the uncircumcised glans is not exposed to air and does not remain as dry as that of a circumcised glans. Older adults may develop yeast infections around dentures. CAUSES  Women  Antibiotics.  Steroid medication taken for a long time.  Being overweight (obese).  Diabetes.  Poor immune condition.  Certain serious medical conditions.  Immune suppressive medications for organ transplant patients.  Chemotherapy.  Pregnancy.  Menstruation.  Stress and fatigue.  Intravenous drug use.  Oral contraceptives.  Wearing tight-fitting clothes in the crotch area.  Catching it from a sex partner who has a yeast infection.  Spermicide.  Intravenous, urinary, or other catheters. Men  Catching it from a sex partner who has a yeast infection.  Having oral or anal sex with a person who has the infection.  Spermicide.  Diabetes.  Antibiotics.  Poor immune system.  Medications that suppress the immune system.  Intravenous drug use.  Intravenous, urinary, or other catheters. SYMPTOMS  Women  Thick, white vaginal discharge.  Vaginal itching.  Redness and  swelling in and around the vagina.  Irritation of the lips of the vagina and perineum.  Blisters on the vaginal lips and perineum.  Painful sexual intercourse.  Low blood sugar (hypoglycemia).  Painful urination.  Bladder infections.  Intestinal problems such as constipation, indigestion, bad breath, bloating, increase in gas, diarrhea, or loose stools. Men  Men may develop intestinal problems such as constipation, indigestion, bad breath, bloating, increase in gas, diarrhea, or loose stools.  Dry, cracked skin on the penis with itching or discomfort.  Jock itch.  Dry, flaky skin.  Athlete's foot.  Hypoglycemia. DIAGNOSIS  Women  A history and an exam are performed.  The discharge may be examined under a microscope.  A culture may be taken of the discharge. Men  A history and an exam are performed.  Any discharge from the penis or areas of cracked skin will be looked at under the microscope and cultured.  Stool samples may be cultured. TREATMENT  Women  Vaginal antifungal suppositories and creams.  Medicated creams to decrease irritation and itching on the outside of the vagina.  Warm compresses to the perineal area to decrease swelling and discomfort.  Oral antifungal medications.  Medicated vaginal suppositories or cream for repeated or recurrent infections.  Wash and dry the irritation areas before applying the cream.  Eating yogurt with Lactobacillus may help with prevention and treatment.  Sometimes painting the vagina with gentian violet solution may help if creams and suppositories do not work. Men  Antifungal creams and oral antifungal medications.  Sometimes treatment must continue for 30 days after the symptoms go away to prevent recurrence. HOME CARE INSTRUCTIONS  Women  Use cotton underwear and avoid tight-fitting clothing.  Avoid colored, scented toilet paper and deodorant tampons or pads.  Do not douche.  Keep your diabetes  under control.  Finish all the prescribed medications.  Keep your skin clean and dry.  Consume milk or yogurt with Lactobacillus-active culture regularly. If you get frequent yeast infections and think that is what the infection is, there are over-the-counter medications that you can get. If the infection does not show healing in 3 days, talk to your caregiver.  Tell your sex partner you have a yeast infection. Your partner may need treatment also, especially if your infection does not clear up or recurs. Men  Keep your skin clean and dry.  Keep your diabetes under control.  Finish all prescribed medications.  Tell your sex partner that you have a yeast infection so he or she can be treated if necessary. SEEK MEDICAL CARE IF:   Your symptoms do not clear up or worsen in one week after treatment.  You have an oral temperature above 102 F (38.9 C).  You have trouble swallowing or eating for a prolonged time.  You develop blisters on and around your vagina.  You develop vaginal bleeding and it is not your menstrual period.  You develop abdominal pain.  You develop intestinal problems as mentioned above.  You get weak or light-headed.  You have painful or increased urination.  You have pain during sexual intercourse. MAKE SURE YOU:   Understand these instructions. Care of a Feeding Tube People who have trouble swallowing or cannot take food or medicine by mouth are sometimes given feeding tubes. A feeding tube can go into the nose and down to the stomach or through the skin in the abdomen and into the stomach or small bowel. Some of the names of these feeding tubes are gastrostomy tubes, PEG lines, nasogastric tubes, and gastrojejunostomy tubes.  SUPPLIES NEEDED TO CARE FOR THE TUBE SITE Clean gloves. Clean wash cloth, gauze pads, or soft paper towel. Cotton swabs. Skin barrier ointment or cream. Soap and water. Pre-cut foam pads or gauze (that go around the  tube). Tube tape. TUBE SITE CARE Have all supplies ready and available. Wash hands well. Put on clean gloves. Remove the soiled foam pad or gauze, if present, that is found under the tube stabilizer. Change the foam pad or gauze daily or when soiled or moist. Check the skin around the tube site for redness, rash, swelling, drainage, or extra tissue growth. If you notice any of these, call your caregiver. Moisten gauze and cotton swabs with water and soap. Wipe the area closest to the tube (right near the stoma) with cotton swabs. Wipe the surrounding skin with moistened gauze. Rinse with water. Dry the skin and stoma site with a dry gauze pad or soft paper towel. Do not use antibiotic ointments at the tube site. If the skin is red, apply a skin barrier cream or ointment (such as petroleum jelly) in a circular motion, using a cotton swab. The cream or ointment will provide a moisture barrier for the skin and helps with wound healing. Apply a new pre-cut foam pad or gauze around the tube. Secure it with tape around the edges. If no drainage is present, foam pads or gauze may be left off. Use tape or an anchoring device to fasten the feeding tube to the skin for comfort or as directed. Rotate where you tape the tube to avoid skin damage from the adhesive. Position the  person in a semi-upright position (30-45 degree angle). Throw away used supplies. Remove gloves. Wash hands. SUPPLIES NEEDED TO FLUSH A FEEDING TUBE Clean gloves. 60 mL syringe (that connects to the feeding tube). Towel. Water. FLUSHING A FEEDING TUBE  Have all supplies ready and available. Wash hands well. Put on clean gloves. Draw up 30 mL of water in the syringe. Kink the feeding tube while disconnecting it from the feeding-bag tubing or while removing the plug at the end of the tube. Kinking closes the tube and prevents secretions in the tube from spilling out. Insert the tip of the syringe into the end of the feeding  tube. Release the kink. Slowly inject the water. If unable to inject the water, the person with the feeding tube should lay on his or her left side. The tip of the tube may be against the stomach wall, blocking fluid flow. Changing positions may move the tip away from the stomach wall. After repositioning, try injecting the water again. After injecting the water, remove the syringe. Always flush before giving the first medicine, between medicines, and after the final medicine before starting a feeding. This prevents medicines from clogging the tube. Throw away used supplies. Remove gloves. Wash hands. Document Released: 11/26/2005 Document Revised: 11/12/2012 Document Reviewed: 07/10/2012 [facility] Patient Information 2015 [place], [place]. This information is not intended to replace advice given to you by your health care provider. Make sure you discuss any questions you have with your health care provider.   Will watch your condition.  Will get help right away if you are not doing well or get worse. Document Released: 01/03/2005 Document Revised: 04/12/2014 Document Reviewed: 04/17/2010 [facility] Patient Information 2015 [place], [place]. This information is not intended to replace advice given to you by your health care provider. Make sure you discuss any questions you have with your health care provider.

## 2015-07-11 DEATH — deceased
